# Patient Record
Sex: Male | Born: 1965 | Race: White | Hispanic: No | Marital: Single | Smoking: Never smoker
Health system: Southern US, Community
[De-identification: ages and names within clinical notes are randomized; demographics above are authoritative.]

## PROBLEM LIST (undated history)

## (undated) DIAGNOSIS — J45909 Unspecified asthma, uncomplicated: Secondary | ICD-10-CM

## (undated) DIAGNOSIS — I10 Essential (primary) hypertension: Secondary | ICD-10-CM

## (undated) HISTORY — PX: APPENDECTOMY: SHX54

---

## 2003-05-03 ENCOUNTER — Encounter: Admission: RE | Admit: 2003-05-03 | Discharge: 2003-05-03 | Payer: Self-pay | Admitting: Family Medicine

## 2003-05-05 ENCOUNTER — Encounter: Admission: RE | Admit: 2003-05-05 | Discharge: 2003-05-05 | Payer: Self-pay | Admitting: Family Medicine

## 2003-06-17 ENCOUNTER — Ambulatory Visit (HOSPITAL_COMMUNITY): Admission: RE | Admit: 2003-06-17 | Discharge: 2003-06-17 | Payer: Self-pay | Admitting: *Deleted

## 2004-01-03 ENCOUNTER — Observation Stay (HOSPITAL_COMMUNITY): Admission: EM | Admit: 2004-01-03 | Discharge: 2004-01-04 | Payer: Self-pay | Admitting: Emergency Medicine

## 2004-01-03 ENCOUNTER — Encounter: Admission: RE | Admit: 2004-01-03 | Discharge: 2004-01-03 | Payer: Self-pay | Admitting: Family Medicine

## 2005-05-17 IMAGING — CT CT PELVIS W/ CM
1 of 2 series · 15 of 32 positions shown, 20 images · IV contrast (omnipaque)
Comparison: none

CLINICAL DATA: Right lower quadrant pain for one day.  Nausea and diarrhea.  Evaluate for appendicitis.
CT ABDOMEN AND PELVIS WITH CONTRAST, 01/03/04:
Images were obtained after an IV infusion of 100 cc of Omnipaque 300.  Enteric contrast was used. 
CT ABDOMEN:
Lung bases are clear.  The liver, spleen, gallbladder, pancreas, adrenal glands, and right kidney appear normal.  A 1.8 x 1.8 cm cyst is present within the left mid to lower pole renal cortex anteriorly.

[Series 2: routine abdomen · axial · 0.70mm/px · z∈[-321,+49]mm · 15 of 82 slices shown, 20 images]
[im 4/82  soft-tissue]
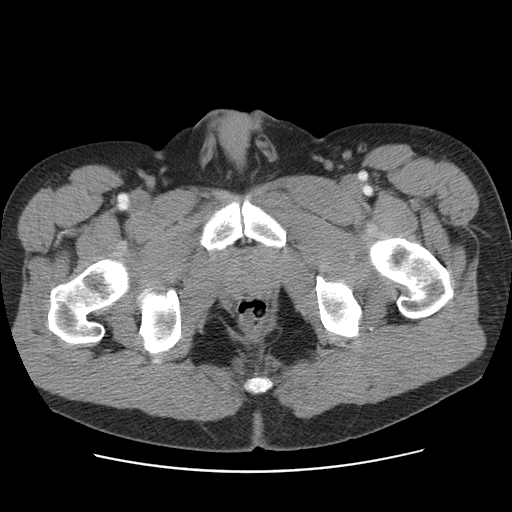
[im 4/82  bone]
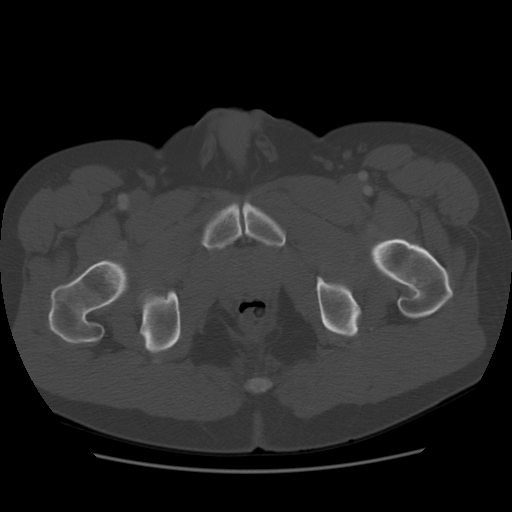
[im 12/82  soft-tissue]
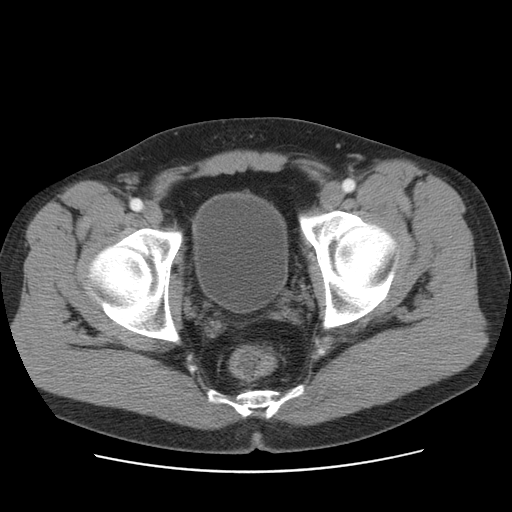
[im 15/82  soft-tissue]
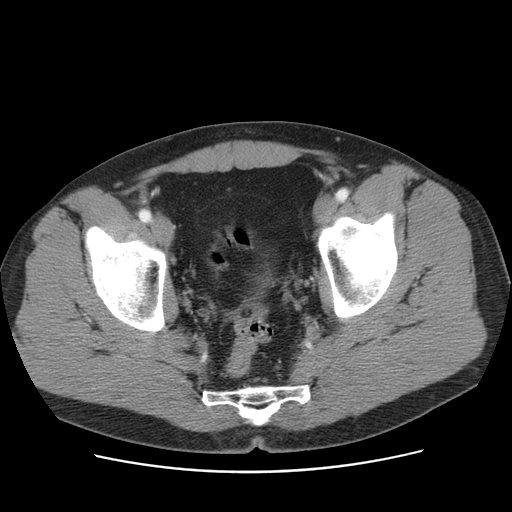
[im 23/82  soft-tissue]
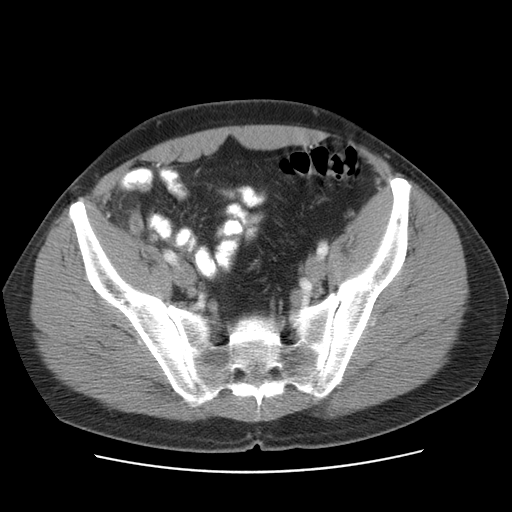
[im 26/82  soft-tissue]
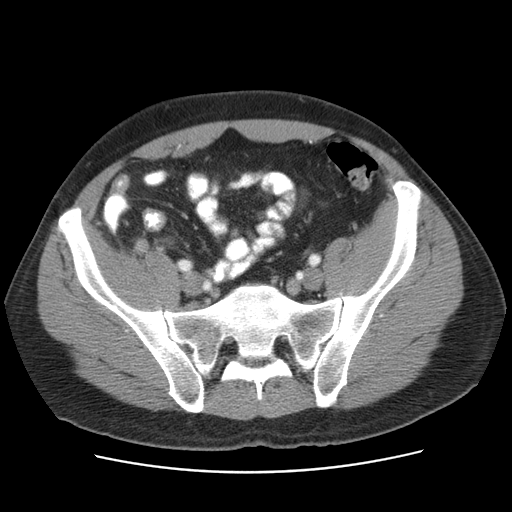
[im 34/82  soft-tissue]
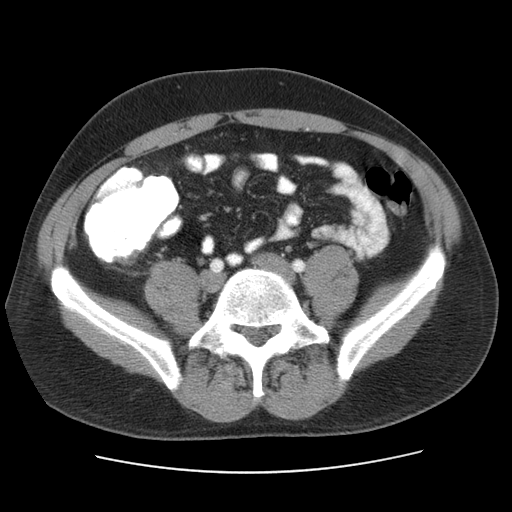
[im 37/82  soft-tissue]
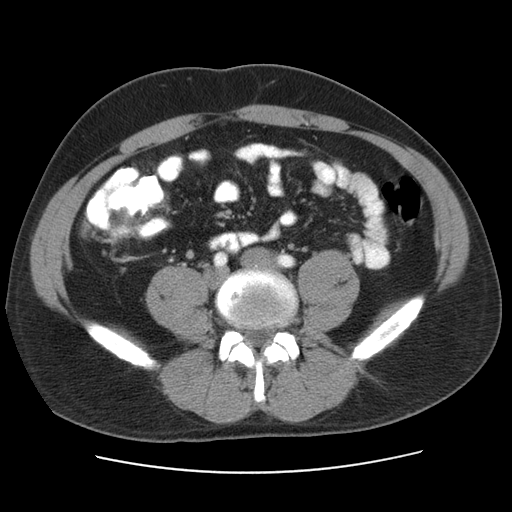
[im 45/82  soft-tissue]
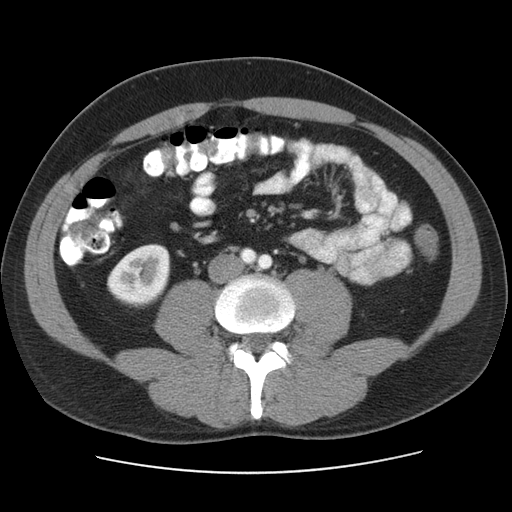
[im 48/82  soft-tissue]
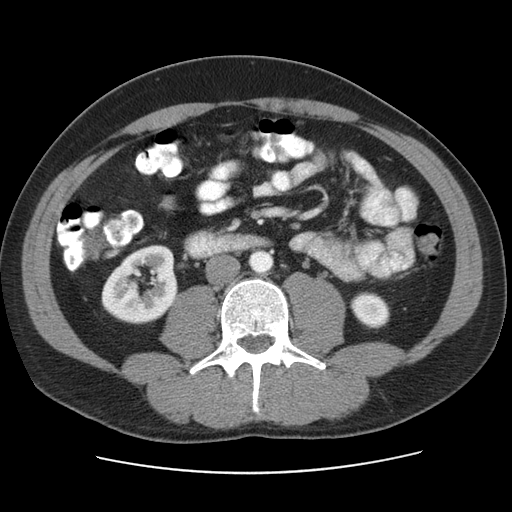
[im 48/82  bone]
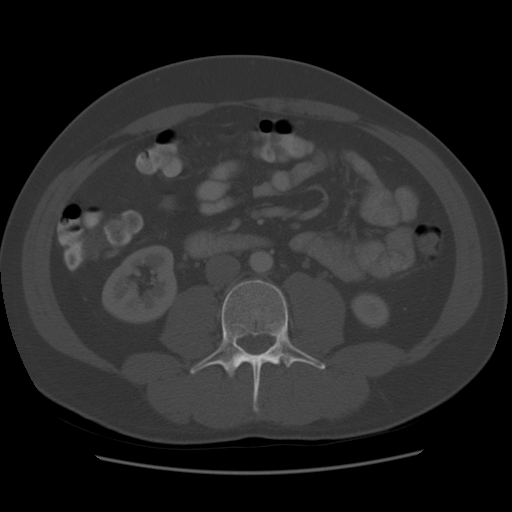
[im 56/82  soft-tissue]
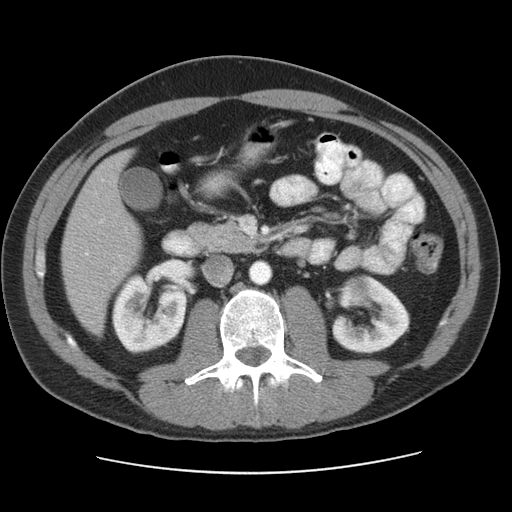
[im 59/82  soft-tissue]
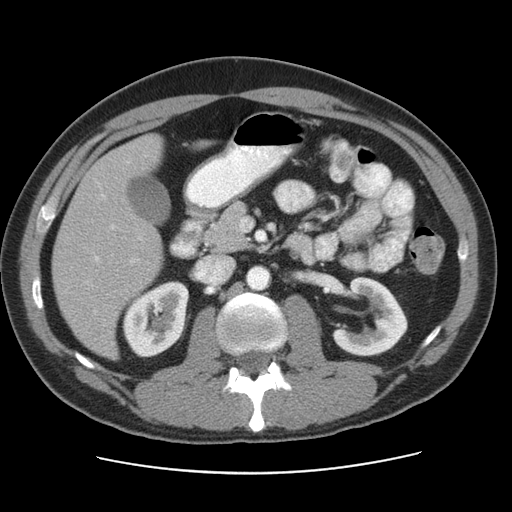
[im 67/82  soft-tissue]
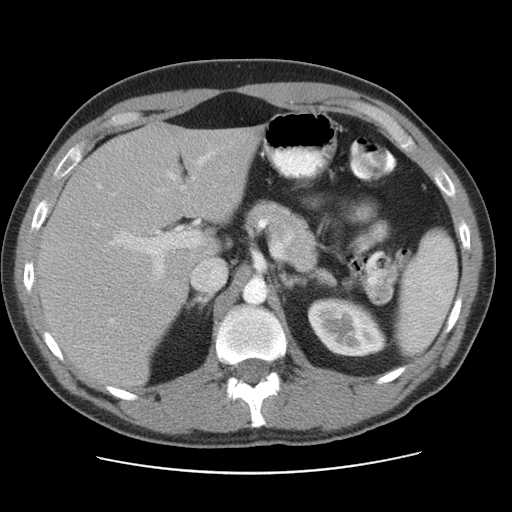
[im 67/82  lung]
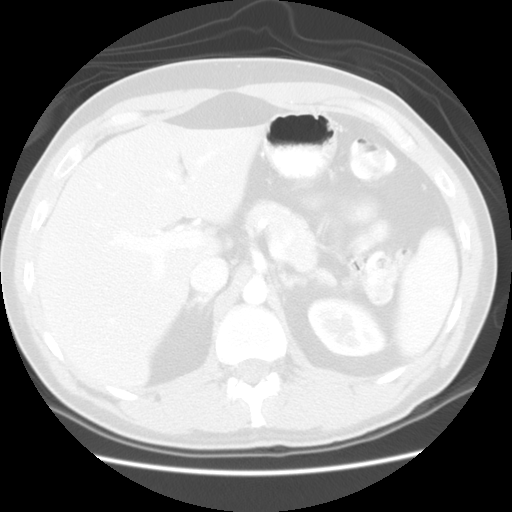
[im 70/82  soft-tissue]
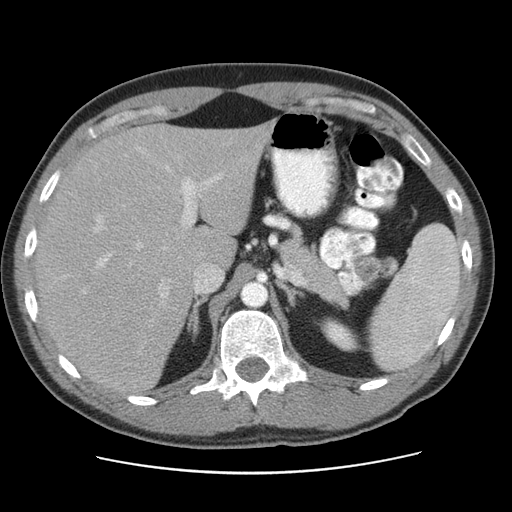
[im 70/82  lung]
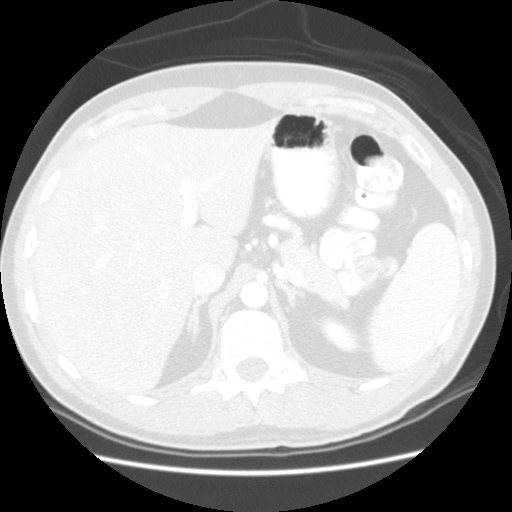
[im 74/82  lung]
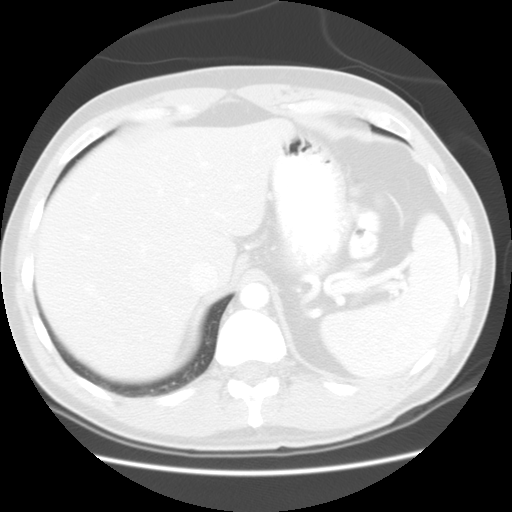
[im 78/82  soft-tissue]
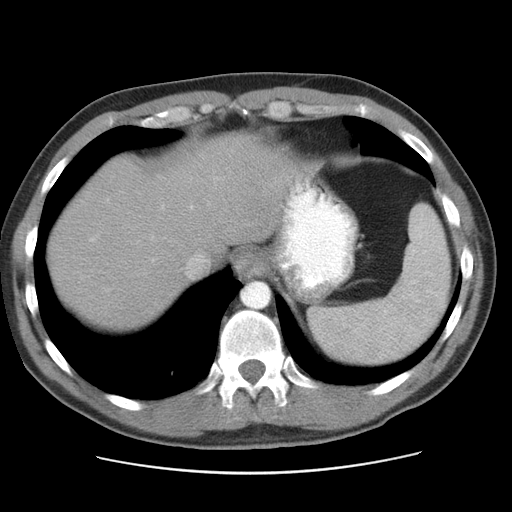
[im 78/82  lung]
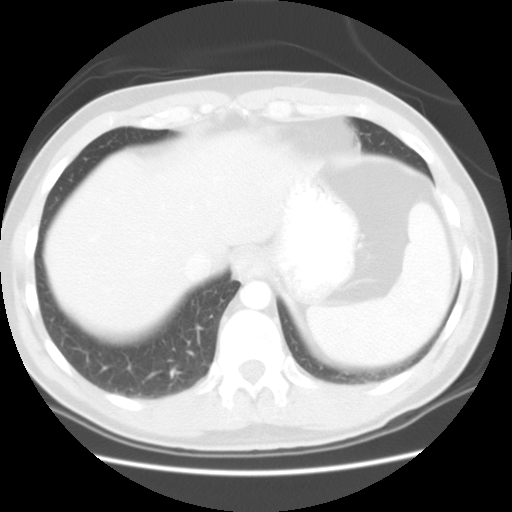

[15 of 32 positions shown; findings below may reference images not displayed]

IMPRESSION: Left renal cyst.  
CT PELVIS:
A tubular-shaped structure is seen extending from the posterior cecum inferiorly that is believed to be a distended appendix with adjacent stranding,  findings suggesting inflammation.  I see no evidence for perforation or abscess.  The bladder and prostate gland appear normal.  
Evaluation of all the bone windows show no worrisome findings.
IMPRESSION: CT findings suspicious for appendicitis.

## 2013-03-18 ENCOUNTER — Encounter: Payer: Self-pay | Admitting: *Deleted

## 2013-04-03 ENCOUNTER — Other Ambulatory Visit: Payer: Self-pay | Admitting: Physician Assistant

## 2015-08-05 DIAGNOSIS — D225 Melanocytic nevi of trunk: Secondary | ICD-10-CM | POA: Diagnosis not present

## 2015-08-05 DIAGNOSIS — L814 Other melanin hyperpigmentation: Secondary | ICD-10-CM | POA: Diagnosis not present

## 2015-08-05 DIAGNOSIS — L910 Hypertrophic scar: Secondary | ICD-10-CM | POA: Diagnosis not present

## 2015-08-05 DIAGNOSIS — L738 Other specified follicular disorders: Secondary | ICD-10-CM | POA: Diagnosis not present

## 2015-08-05 DIAGNOSIS — L905 Scar conditions and fibrosis of skin: Secondary | ICD-10-CM | POA: Diagnosis not present

## 2016-12-09 DIAGNOSIS — J069 Acute upper respiratory infection, unspecified: Secondary | ICD-10-CM | POA: Diagnosis not present

## 2017-04-14 DIAGNOSIS — Z131 Encounter for screening for diabetes mellitus: Secondary | ICD-10-CM | POA: Diagnosis not present

## 2017-04-14 DIAGNOSIS — Z0000 Encounter for general adult medical examination without abnormal findings: Secondary | ICD-10-CM | POA: Diagnosis not present

## 2017-04-14 DIAGNOSIS — Z13220 Encounter for screening for lipoid disorders: Secondary | ICD-10-CM | POA: Diagnosis not present

## 2017-05-05 DIAGNOSIS — R1310 Dysphagia, unspecified: Secondary | ICD-10-CM | POA: Diagnosis not present

## 2017-05-05 DIAGNOSIS — Z1211 Encounter for screening for malignant neoplasm of colon: Secondary | ICD-10-CM | POA: Diagnosis not present

## 2017-09-01 DIAGNOSIS — K640 First degree hemorrhoids: Secondary | ICD-10-CM | POA: Diagnosis not present

## 2017-09-01 DIAGNOSIS — K210 Gastro-esophageal reflux disease with esophagitis: Secondary | ICD-10-CM | POA: Diagnosis not present

## 2017-09-01 DIAGNOSIS — Z1211 Encounter for screening for malignant neoplasm of colon: Secondary | ICD-10-CM | POA: Diagnosis not present

## 2017-09-01 DIAGNOSIS — K209 Esophagitis, unspecified: Secondary | ICD-10-CM | POA: Diagnosis not present

## 2017-09-01 DIAGNOSIS — K5730 Diverticulosis of large intestine without perforation or abscess without bleeding: Secondary | ICD-10-CM | POA: Diagnosis not present

## 2017-09-01 DIAGNOSIS — K228 Other specified diseases of esophagus: Secondary | ICD-10-CM | POA: Diagnosis not present

## 2017-09-01 DIAGNOSIS — D126 Benign neoplasm of colon, unspecified: Secondary | ICD-10-CM | POA: Diagnosis not present

## 2017-09-01 DIAGNOSIS — R1310 Dysphagia, unspecified: Secondary | ICD-10-CM | POA: Diagnosis not present

## 2017-09-06 DIAGNOSIS — D126 Benign neoplasm of colon, unspecified: Secondary | ICD-10-CM | POA: Diagnosis not present

## 2017-09-06 DIAGNOSIS — Z1211 Encounter for screening for malignant neoplasm of colon: Secondary | ICD-10-CM | POA: Diagnosis not present

## 2017-09-06 DIAGNOSIS — K209 Esophagitis, unspecified: Secondary | ICD-10-CM | POA: Diagnosis not present

## 2017-09-06 DIAGNOSIS — K210 Gastro-esophageal reflux disease with esophagitis: Secondary | ICD-10-CM | POA: Diagnosis not present

## 2017-11-24 DIAGNOSIS — R12 Heartburn: Secondary | ICD-10-CM | POA: Diagnosis not present

## 2017-11-24 DIAGNOSIS — K210 Gastro-esophageal reflux disease with esophagitis: Secondary | ICD-10-CM | POA: Diagnosis not present

## 2017-11-24 DIAGNOSIS — K2900 Acute gastritis without bleeding: Secondary | ICD-10-CM | POA: Diagnosis not present

## 2017-11-24 DIAGNOSIS — K200 Eosinophilic esophagitis: Secondary | ICD-10-CM | POA: Diagnosis not present

## 2017-11-24 DIAGNOSIS — K2930 Chronic superficial gastritis without bleeding: Secondary | ICD-10-CM | POA: Diagnosis not present

## 2017-11-24 DIAGNOSIS — K228 Other specified diseases of esophagus: Secondary | ICD-10-CM | POA: Diagnosis not present

## 2017-11-29 DIAGNOSIS — K2930 Chronic superficial gastritis without bleeding: Secondary | ICD-10-CM | POA: Diagnosis not present

## 2017-11-29 DIAGNOSIS — K210 Gastro-esophageal reflux disease with esophagitis: Secondary | ICD-10-CM | POA: Diagnosis not present

## 2017-11-29 DIAGNOSIS — K200 Eosinophilic esophagitis: Secondary | ICD-10-CM | POA: Diagnosis not present

## 2018-03-22 DIAGNOSIS — J019 Acute sinusitis, unspecified: Secondary | ICD-10-CM | POA: Diagnosis not present

## 2018-03-22 DIAGNOSIS — Z112 Encounter for screening for other bacterial diseases: Secondary | ICD-10-CM | POA: Diagnosis not present

## 2018-03-22 DIAGNOSIS — Z1159 Encounter for screening for other viral diseases: Secondary | ICD-10-CM | POA: Diagnosis not present

## 2018-03-22 DIAGNOSIS — J309 Allergic rhinitis, unspecified: Secondary | ICD-10-CM | POA: Diagnosis not present

## 2018-03-22 DIAGNOSIS — K200 Eosinophilic esophagitis: Secondary | ICD-10-CM | POA: Diagnosis not present

## 2018-03-22 DIAGNOSIS — J069 Acute upper respiratory infection, unspecified: Secondary | ICD-10-CM | POA: Diagnosis not present

## 2018-03-22 DIAGNOSIS — J101 Influenza due to other identified influenza virus with other respiratory manifestations: Secondary | ICD-10-CM | POA: Diagnosis not present

## 2018-03-22 DIAGNOSIS — J4540 Moderate persistent asthma, uncomplicated: Secondary | ICD-10-CM | POA: Diagnosis not present

## 2018-08-20 DIAGNOSIS — R1030 Lower abdominal pain, unspecified: Secondary | ICD-10-CM | POA: Diagnosis not present

## 2018-08-20 DIAGNOSIS — K5730 Diverticulosis of large intestine without perforation or abscess without bleeding: Secondary | ICD-10-CM | POA: Diagnosis not present

## 2018-08-20 DIAGNOSIS — Z20828 Contact with and (suspected) exposure to other viral communicable diseases: Secondary | ICD-10-CM | POA: Diagnosis not present

## 2018-08-20 DIAGNOSIS — Z7951 Long term (current) use of inhaled steroids: Secondary | ICD-10-CM | POA: Diagnosis not present

## 2018-08-20 DIAGNOSIS — K5790 Diverticulosis of intestine, part unspecified, without perforation or abscess without bleeding: Secondary | ICD-10-CM | POA: Diagnosis not present

## 2018-08-20 DIAGNOSIS — K921 Melena: Secondary | ICD-10-CM | POA: Diagnosis not present

## 2018-08-20 DIAGNOSIS — Z880 Allergy status to penicillin: Secondary | ICD-10-CM | POA: Diagnosis not present

## 2018-08-20 DIAGNOSIS — R030 Elevated blood-pressure reading, without diagnosis of hypertension: Secondary | ICD-10-CM | POA: Diagnosis not present

## 2018-08-20 DIAGNOSIS — R142 Eructation: Secondary | ICD-10-CM | POA: Diagnosis not present

## 2018-08-20 DIAGNOSIS — N132 Hydronephrosis with renal and ureteral calculous obstruction: Secondary | ICD-10-CM | POA: Diagnosis not present

## 2018-08-20 DIAGNOSIS — N201 Calculus of ureter: Secondary | ICD-10-CM | POA: Diagnosis not present

## 2018-08-29 DIAGNOSIS — N200 Calculus of kidney: Secondary | ICD-10-CM | POA: Diagnosis not present

## 2018-09-12 DIAGNOSIS — N200 Calculus of kidney: Secondary | ICD-10-CM | POA: Diagnosis not present

## 2018-09-12 DIAGNOSIS — N209 Urinary calculus, unspecified: Secondary | ICD-10-CM | POA: Diagnosis not present

## 2018-09-27 DIAGNOSIS — N202 Calculus of kidney with calculus of ureter: Secondary | ICD-10-CM | POA: Diagnosis not present

## 2018-10-29 DIAGNOSIS — Z20828 Contact with and (suspected) exposure to other viral communicable diseases: Secondary | ICD-10-CM | POA: Diagnosis not present

## 2018-11-01 DIAGNOSIS — U071 COVID-19: Secondary | ICD-10-CM | POA: Diagnosis not present

## 2018-11-01 DIAGNOSIS — Z03818 Encounter for observation for suspected exposure to other biological agents ruled out: Secondary | ICD-10-CM | POA: Diagnosis not present

## 2019-03-07 DIAGNOSIS — N200 Calculus of kidney: Secondary | ICD-10-CM | POA: Diagnosis not present

## 2019-05-16 DIAGNOSIS — M9903 Segmental and somatic dysfunction of lumbar region: Secondary | ICD-10-CM | POA: Diagnosis not present

## 2019-05-21 DIAGNOSIS — M9903 Segmental and somatic dysfunction of lumbar region: Secondary | ICD-10-CM | POA: Diagnosis not present

## 2019-05-23 DIAGNOSIS — R109 Unspecified abdominal pain: Secondary | ICD-10-CM | POA: Diagnosis not present

## 2019-05-24 ENCOUNTER — Other Ambulatory Visit: Payer: Self-pay | Admitting: Family Medicine

## 2019-05-24 DIAGNOSIS — R109 Unspecified abdominal pain: Secondary | ICD-10-CM

## 2019-06-13 ENCOUNTER — Other Ambulatory Visit: Payer: Self-pay

## 2020-04-28 DIAGNOSIS — R0789 Other chest pain: Secondary | ICD-10-CM | POA: Diagnosis not present

## 2020-04-28 DIAGNOSIS — I10 Essential (primary) hypertension: Secondary | ICD-10-CM | POA: Diagnosis not present

## 2020-05-14 DIAGNOSIS — R300 Dysuria: Secondary | ICD-10-CM | POA: Diagnosis not present

## 2020-05-14 DIAGNOSIS — R102 Pelvic and perineal pain: Secondary | ICD-10-CM | POA: Diagnosis not present

## 2020-05-14 DIAGNOSIS — Z87442 Personal history of urinary calculi: Secondary | ICD-10-CM | POA: Diagnosis not present

## 2020-05-19 DIAGNOSIS — I10 Essential (primary) hypertension: Secondary | ICD-10-CM | POA: Diagnosis not present

## 2020-05-19 DIAGNOSIS — R0600 Dyspnea, unspecified: Secondary | ICD-10-CM | POA: Diagnosis not present

## 2020-05-19 DIAGNOSIS — R7303 Prediabetes: Secondary | ICD-10-CM | POA: Diagnosis not present

## 2020-09-19 DIAGNOSIS — U099 Post covid-19 condition, unspecified: Secondary | ICD-10-CM | POA: Diagnosis not present

## 2020-09-19 DIAGNOSIS — R7303 Prediabetes: Secondary | ICD-10-CM | POA: Diagnosis not present

## 2020-09-19 DIAGNOSIS — I10 Essential (primary) hypertension: Secondary | ICD-10-CM | POA: Diagnosis not present

## 2021-03-16 DIAGNOSIS — I10 Essential (primary) hypertension: Secondary | ICD-10-CM | POA: Diagnosis not present

## 2021-03-25 DIAGNOSIS — Z125 Encounter for screening for malignant neoplasm of prostate: Secondary | ICD-10-CM | POA: Diagnosis not present

## 2021-03-25 DIAGNOSIS — Z0000 Encounter for general adult medical examination without abnormal findings: Secondary | ICD-10-CM | POA: Diagnosis not present

## 2021-03-25 DIAGNOSIS — Z23 Encounter for immunization: Secondary | ICD-10-CM | POA: Diagnosis not present

## 2021-06-11 DIAGNOSIS — I10 Essential (primary) hypertension: Secondary | ICD-10-CM | POA: Diagnosis not present

## 2021-09-23 DIAGNOSIS — K200 Eosinophilic esophagitis: Secondary | ICD-10-CM | POA: Diagnosis not present

## 2021-09-23 DIAGNOSIS — Z23 Encounter for immunization: Secondary | ICD-10-CM | POA: Diagnosis not present

## 2021-09-23 DIAGNOSIS — I10 Essential (primary) hypertension: Secondary | ICD-10-CM | POA: Diagnosis not present

## 2021-09-23 DIAGNOSIS — E782 Mixed hyperlipidemia: Secondary | ICD-10-CM | POA: Diagnosis not present

## 2021-09-23 DIAGNOSIS — R7303 Prediabetes: Secondary | ICD-10-CM | POA: Diagnosis not present

## 2022-02-19 DIAGNOSIS — J321 Chronic frontal sinusitis: Secondary | ICD-10-CM | POA: Diagnosis not present

## 2022-03-31 DIAGNOSIS — Z0000 Encounter for general adult medical examination without abnormal findings: Secondary | ICD-10-CM | POA: Diagnosis not present

## 2022-03-31 DIAGNOSIS — I10 Essential (primary) hypertension: Secondary | ICD-10-CM | POA: Diagnosis not present

## 2022-03-31 DIAGNOSIS — E782 Mixed hyperlipidemia: Secondary | ICD-10-CM | POA: Diagnosis not present

## 2022-03-31 DIAGNOSIS — H9193 Unspecified hearing loss, bilateral: Secondary | ICD-10-CM | POA: Diagnosis not present

## 2022-03-31 DIAGNOSIS — R7303 Prediabetes: Secondary | ICD-10-CM | POA: Diagnosis not present

## 2022-04-13 ENCOUNTER — Other Ambulatory Visit: Payer: Self-pay

## 2022-04-13 ENCOUNTER — Emergency Department (HOSPITAL_BASED_OUTPATIENT_CLINIC_OR_DEPARTMENT_OTHER)
Admission: EM | Admit: 2022-04-13 | Discharge: 2022-04-14 | Disposition: A | Discharge status: Home / Self Care | Payer: BC Managed Care – PPO | Attending: Emergency Medicine | Admitting: Emergency Medicine

## 2022-04-13 ENCOUNTER — Emergency Department (HOSPITAL_BASED_OUTPATIENT_CLINIC_OR_DEPARTMENT_OTHER): Payer: BC Managed Care – PPO

## 2022-04-13 ENCOUNTER — Encounter (HOSPITAL_BASED_OUTPATIENT_CLINIC_OR_DEPARTMENT_OTHER): Payer: Self-pay | Admitting: Emergency Medicine

## 2022-04-13 DIAGNOSIS — K922 Gastrointestinal hemorrhage, unspecified: Secondary | ICD-10-CM | POA: Diagnosis not present

## 2022-04-13 DIAGNOSIS — K920 Hematemesis: Secondary | ICD-10-CM | POA: Insufficient documentation

## 2022-04-13 DIAGNOSIS — K5730 Diverticulosis of large intestine without perforation or abscess without bleeding: Secondary | ICD-10-CM | POA: Diagnosis not present

## 2022-04-13 LAB — CBC
HCT: 43.1 % (ref 39.0–52.0)
Hemoglobin: 15 g/dL (ref 13.0–17.0)
MCH: 31.7 pg (ref 26.0–34.0)
MCHC: 34.8 g/dL (ref 30.0–36.0)
MCV: 91.1 fL (ref 80.0–100.0)
Platelets: 216 10*3/uL (ref 150–400)
RBC: 4.73 MIL/uL (ref 4.22–5.81)
RDW: 12.3 % (ref 11.5–15.5)
WBC: 5 10*3/uL (ref 4.0–10.5)
nRBC: 0 % (ref 0.0–0.2)

## 2022-04-13 LAB — COMPREHENSIVE METABOLIC PANEL
ALT: 32 U/L (ref 0–44)
AST: 24 U/L (ref 15–41)
Albumin: 4.1 g/dL (ref 3.5–5.0)
Alkaline Phosphatase: 59 U/L (ref 38–126)
Anion gap: 8 (ref 5–15)
BUN: 18 mg/dL (ref 6–20)
CO2: 25 mmol/L (ref 22–32)
Calcium: 9 mg/dL (ref 8.9–10.3)
Chloride: 102 mmol/L (ref 98–111)
Creatinine, Ser: 0.89 mg/dL (ref 0.61–1.24)
GFR, Estimated: >60 mL/min (ref >60)
Glucose, Bld: 102 mg/dL — ABNORMAL HIGH (ref 70–99)
Potassium: 3.6 mmol/L (ref 3.5–5.1)
Sodium: 135 mmol/L (ref 135–145)
Total Bilirubin: 0.5 mg/dL (ref 0.3–1.2)
Total Protein: 6.5 g/dL (ref 6.5–8.1)

## 2022-04-13 MED ORDER — PANTOPRAZOLE SODIUM 40 MG IV SOLR
40.0000 mg | Freq: Once | INTRAVENOUS | Status: AC
  Administered 2022-04-13: 40 mg via INTRAVENOUS
  Filled 2022-04-13: qty 10

## 2022-04-13 MED ORDER — SODIUM CHLORIDE 0.9 % IV BOLUS
1000.0000 mL | Freq: Once | INTRAVENOUS | Status: AC
  Administered 2022-04-13: 1000 mL via INTRAVENOUS

## 2022-04-13 MED ORDER — IOHEXOL 350 MG/ML SOLN
100.0000 mL | Freq: Once | INTRAVENOUS | Status: AC | PRN
  Administered 2022-04-13: 100 mL via INTRAVENOUS

## 2022-04-13 NOTE — ED Provider Notes (Incomplete)
[facility] EMERGENCY DEPARTMENT AT [facility] Provider Note   CSN: [id number] Arrival date & time: 04/13/22  1741     History {Add pertinent medical, surgical, social history, OB history to HPI:1} Chief Complaint  Patient presents with  . Hematemesis    Lawrence Long is a 57 y.o. male with a past medical history of EOE presents today for evaluation of hematemesis.  Patient reports after having dinner on Saturday last week he started to have dry heaves and then vomited bright red blood.  He reports that the following day he started to have diarrhea with dark stool.  He went to [place] walk in clinic today where they recommended him to go to the ER for further workup.  States he did not have any vomiting yesterday.  States he continues to have diarrhea with dark stool.  Denies any fever, chest pain, shortness of breath, constipation, urinary symptoms.  HPI  History reviewed. No pertinent past medical history. Past Surgical History:  Procedure Laterality Date  . APPENDECTOMY       Home Medications Prior to Admission medications   Not on File      Allergies    Ampicillin    Review of Systems   Review of Systems Negative except as per HPI.  Physical Exam Updated Vital Signs BP 131/82   Pulse 75   Temp 98.7 F (37.1 C) (Oral)   Resp 18   SpO2 96%  Physical Exam Vitals and nursing note reviewed.  Constitutional:      Appearance: Normal appearance.  HENT:     Head: Normocephalic and atraumatic.     Mouth/Throat:     Mouth: Mucous membranes are moist.  Eyes:     General: No scleral icterus. Cardiovascular:     Rate and Rhythm: Normal rate and regular rhythm.     Pulses: Normal pulses.     Heart sounds: Normal heart sounds.  Pulmonary:     Effort: Pulmonary effort is normal.     Breath sounds: Normal breath sounds.  Abdominal:     General: Abdomen is flat.     Palpations: Abdomen is soft.     Tenderness: There is no abdominal tenderness.   Musculoskeletal:        General: No deformity.  Skin:    General: Skin is warm.     Findings: No rash.  Neurological:     General: No focal deficit present.     Mental Status: He is alert.  Psychiatric:        Mood and Affect: Mood normal.     ED Results / Procedures / Treatments   Labs (all labs ordered are listed, but only abnormal results are displayed) Labs Reviewed  COMPREHENSIVE METABOLIC PANEL - Abnormal; Notable for the following components:      Result Value   Glucose, Bld 102 (*)    All other components within normal limits  CBC    EKG None  Radiology No results found.  Procedures Procedures  {Document cardiac monitor, telemetry assessment procedure when appropriate:1}  Medications Ordered in ED Medications - No data to display  ED Course/ Medical Decision Making/ A&P   {   Click here for ABCD2, HEART and other calculatorsREFRESH Note before signing :1}                          Medical Decision Making Amount and/or Complexity of Data Reviewed Labs: ordered.   ***  {Document critical  care time when appropriate:1} {Document review of labs and clinical decision tools ie heart score, Chads2Vasc2 etc:1}  {Document your independent review of radiology images, and any outside records:1} {Document your discussion with family members, caretakers, and with consultants:1} {Document social determinants of health affecting pt's care:1} {Document your decision making why or why not admission, treatments were needed:1} Final Clinical Impression(s) / ED Diagnoses Final diagnoses:  None    Rx / DC Orders ED Discharge Orders     None

## 2022-04-13 NOTE — ED Triage Notes (Signed)
Blood in emesis on Sunday. N/v/d since. [facility]en by [facility] and referred for GIB work up

## 2022-04-13 NOTE — ED Provider Notes (Signed)
[place] [facility]   CSN: [id number] Arrival date & time: 04/13/22  1741     History  Chief Complaint  Patient presents with   Hematemesis    Lawrence Long is a 57 y.o. male with a past medical history of EOE presents today for evaluation of hematemesis.  Patient reports after having dinner on Saturday last week he started to have dry heaves and then vomited bright red blood.  He reports that the following day he started to have diarrhea with dark stool.  [facility] where they recommended him to go to the ER for further workup.  States he did not have any vomiting yesterday.  States he continues to have diarrhea with dark stool yesterday.  Denies any fever, chest pain, shortness of breath, constipation, urinary symptoms.  HPI  History reviewed. No pertinent past medical history. Past Surgical History:  Procedure Laterality Date   APPENDECTOMY       Home Medications Prior to Admission medications   Not on File      Allergies    Ampicillin    Review of Systems   Review of Systems Negative except as per HPI.  Physical Exam Updated Vital Signs BP 131/82   Pulse 75   Temp 98.7 F (37.1 C) (Oral)   Resp 18   SpO2 96%  Physical Exam Vitals and nursing note reviewed.  Constitutional:      Appearance: Normal appearance.  HENT:     Head: Normocephalic and atraumatic.     Mouth/Throat:     Mouth: Mucous membranes are moist.  Eyes:     General: No scleral icterus. Cardiovascular:     Rate and Rhythm: Normal rate and regular rhythm.     Pulses: Normal pulses.     Heart sounds: Normal heart sounds.  Pulmonary:     Effort: Pulmonary effort is normal.     Breath sounds: Normal breath sounds.  Abdominal:     General: Abdomen is flat.     Palpations: Abdomen is soft.     Tenderness: There is no abdominal tenderness.  Musculoskeletal:        General: No deformity.  Skin:    General:  Skin is warm.     Findings: No rash.  Neurological:     General: No focal deficit present.     Mental Status: He is alert.  Psychiatric:        Mood and Affect: Mood normal.     ED Results / Procedures / Treatments   Labs (all labs ordered are listed, but only abnormal results are displayed) Labs Reviewed  COMPREHENSIVE METABOLIC PANEL - Abnormal; Notable for the following components:      Result Value   Glucose, Bld 102 (*)    All other components within normal limits  CBC    EKG None  Radiology No results found.  Procedures Procedures    Medications Ordered in ED Medications - No data to display  ED Course/ Medical Decision Making/ A&P                             Medical Decision Making Amount and/or Complexity of Data Reviewed Labs: ordered. Radiology: ordered.  Risk Prescription drug management.   This patient presents to the ED for hematemesis, this involves an extensive number of treatment options, and is a complaint that carries with a high risk  of complications and morbidity.  The differential diagnosis includes manage request, esophagitis, PUD, cirrhosis, bleed.  This is not an exhaustive list.  Lab tests: I ordered and personally interpreted labs.  The pertinent results include: WBC unremarkable. Hbg unremarkable. Platelets unremarkable. Electrolytes unremarkable. BUN, creatinine unremarkable.   Imaging studies: I ordered imaging studies. I personally reviewed, interpreted imaging and agree with the radiologist's interpretations. The results include: CT angio GI bleed showed no active GI bleed.  Problem list/ ED course/ Critical interventions/ Medical management: HPI: See above Vital signs within normal range and stable throughout visit. Laboratory/imaging studies significant for: See above. On physical examination, patient is afebrile and appears in no acute distress.  This patient presents with symptoms concerning for an upper GI bleed.  Differential diagnoses includes peptic ulcer disease, versus gastritis/gastric ulcer, versus possible AVM. Presentation not consistent with esophageal or gastric variceal bleeding or Boerhaave's syndrome. Presentation not consistent with other etiologies upper GI bleeding at this time. No red flag features or high risk bleeding. No evidence of hemorrhagic shock. Glasgow-Blatchford Bleeding (GBS) score: 0. Based on this well validated study, the patient can safely be discharged for outpatient therapy.  I sent an Rx of Zofran and Imodium.  Advised patient to follow-up with gastroenterology for further evaluation and management.  Strict return precautions given.   I have reviewed the patient home medicines and have made adjustments as needed.  Cardiac monitoring/EKG: The patient was maintained on a cardiac monitor.  I personally reviewed and interpreted the cardiac monitor which showed an underlying rhythm of: sinus rhythm.  Additional history obtained: External records from outside source obtained and reviewed including: Chart review including previous notes, labs, imaging.  Consultations obtained:  Disposition Continued outpatient therapy. Follow-up with GI recommended for reevaluation of symptoms. Treatment plan discussed with patient.  Pt acknowledged understanding was agreeable to the plan. Worrisome signs and symptoms were discussed with patient, and patient acknowledged understanding to return to the ED if they noticed these signs and symptoms. Patient was stable upon discharge.   This chart was dictated using voice recognition software.  Despite best efforts to proofread,  errors can occur which can change the documentation meaning.          Final Clinical Impression(s) / ED Diagnoses Final diagnoses:  Hematemesis with nausea    Rx / DC Orders ED Discharge Orders          Ordered    ondansetron (ZOFRAN-ODT) 4 MG disintegrating tablet  Every 8 hours PRN        04/14/22 0017     loperamide (IMODIUM) 2 MG capsule  4 times daily PRN        04/14/22 0017              Jeanelle MallingLe, Miraya Cudney, PA 04/16/22 1056    Mardene SayerBranham, Victoria C, MD 04/16/22 1242

## 2022-04-14 MED ORDER — LOPERAMIDE HCL 2 MG PO CAPS: 2.0000 mg | ORAL_CAPSULE | Freq: Four times a day (QID) | ORAL | 0 refills | Status: DC | PRN

## 2022-04-14 MED ORDER — ONDANSETRON 4 MG PO TBDP: 4.0000 mg | ORAL_TABLET | Freq: Three times a day (TID) | ORAL | 0 refills | Status: DC | PRN

## 2022-04-14 NOTE — Discharge Instructions (Addendum)
Please take your medications as prescribed. Take tylenol/ibuprofen for pain. I recommend close follow-up with gastroenterology for reevaluation.  Please do not hesitate to return to emergency department if worrisome signs symptoms we discussed become apparent.  

## 2022-06-30 DIAGNOSIS — R1310 Dysphagia, unspecified: Secondary | ICD-10-CM | POA: Diagnosis not present

## 2022-06-30 DIAGNOSIS — Z09 Encounter for follow-up examination after completed treatment for conditions other than malignant neoplasm: Secondary | ICD-10-CM | POA: Diagnosis not present

## 2022-06-30 DIAGNOSIS — Z86010 Personal history of colonic polyps: Secondary | ICD-10-CM | POA: Diagnosis not present

## 2022-07-20 DIAGNOSIS — K2930 Chronic superficial gastritis without bleeding: Secondary | ICD-10-CM | POA: Diagnosis not present

## 2022-07-20 DIAGNOSIS — K31A19 Gastric intestinal metaplasia without dysplasia, unspecified site: Secondary | ICD-10-CM | POA: Diagnosis not present

## 2022-07-20 DIAGNOSIS — K2940 Chronic atrophic gastritis without bleeding: Secondary | ICD-10-CM | POA: Diagnosis not present

## 2022-07-20 DIAGNOSIS — Z86010 Personal history of colonic polyps: Secondary | ICD-10-CM | POA: Diagnosis not present

## 2022-07-20 DIAGNOSIS — Z09 Encounter for follow-up examination after completed treatment for conditions other than malignant neoplasm: Secondary | ICD-10-CM | POA: Diagnosis not present

## 2022-07-20 DIAGNOSIS — K2100 Gastro-esophageal reflux disease with esophagitis, without bleeding: Secondary | ICD-10-CM | POA: Diagnosis not present

## 2022-07-20 DIAGNOSIS — K5730 Diverticulosis of large intestine without perforation or abscess without bleeding: Secondary | ICD-10-CM | POA: Diagnosis not present

## 2022-07-20 DIAGNOSIS — K200 Eosinophilic esophagitis: Secondary | ICD-10-CM | POA: Diagnosis not present

## 2022-07-20 DIAGNOSIS — K648 Other hemorrhoids: Secondary | ICD-10-CM | POA: Diagnosis not present

## 2022-07-20 DIAGNOSIS — R1310 Dysphagia, unspecified: Secondary | ICD-10-CM | POA: Diagnosis not present

## 2022-07-20 DIAGNOSIS — D120 Benign neoplasm of cecum: Secondary | ICD-10-CM | POA: Diagnosis not present

## 2022-08-08 DIAGNOSIS — S6991XA Unspecified injury of right wrist, hand and finger(s), initial encounter: Secondary | ICD-10-CM | POA: Diagnosis not present

## 2022-08-08 DIAGNOSIS — Z23 Encounter for immunization: Secondary | ICD-10-CM | POA: Diagnosis not present

## 2022-08-08 DIAGNOSIS — X58XXXA Exposure to other specified factors, initial encounter: Secondary | ICD-10-CM | POA: Diagnosis not present

## 2022-08-17 ENCOUNTER — Ambulatory Visit (INDEPENDENT_AMBULATORY_CARE_PROVIDER_SITE_OTHER): Payer: BC Managed Care – PPO | Admitting: Orthopedic Surgery

## 2022-08-17 ENCOUNTER — Other Ambulatory Visit (INDEPENDENT_AMBULATORY_CARE_PROVIDER_SITE_OTHER): Payer: BC Managed Care – PPO

## 2022-08-17 DIAGNOSIS — M79644 Pain in right finger(s): Secondary | ICD-10-CM

## 2022-08-17 DIAGNOSIS — L089 Local infection of the skin and subcutaneous tissue, unspecified: Secondary | ICD-10-CM

## 2022-08-17 DIAGNOSIS — S60942A Unspecified superficial injury of right middle finger, initial encounter: Secondary | ICD-10-CM | POA: Diagnosis not present

## 2022-08-17 NOTE — H&P (View-Only) (Signed)
Lawrence Long - 57 y.o. male MRN [id number]  Date of birth: 06-08-1965  Office Visit Note: Visit Date: 08/17/2022 PCP: Pcp, No Referred by: No ref. provider found  Subjective: No chief complaint on file.  HPI: [facility] Long is a pleasant 57 y.o. male who presents today for evaluation of ongoing pain and swelling of the distal aspect of the right long finger.  He sustained a crush injury on 729, approximately 2 weeks prior, was crushed by a metal fender.  Did not seek medical care at that time.  Was recently seen in urgent care earlier this week for ongoing swelling and drainage, does state that there has been purulence notable.  Denies any fevers or chills, no systemic symptoms.  He is right-hand dominant, overall healthy and active at baseline.  Has been taking over-the-counter antibiotics since his recent urgent care visit.  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   Assessment & Plan: Visit Diagnoses:  1. Pain in right finger(s)     Plan: Extensive discussion was had the patient today regarding his right long finger.  Clinically, there does appear to be evidence of ongoing swelling and drainage at the distal aspect of the long finger, there is concern for potential abscess or reaction to a foreign material within this region.  There is evidence of nailbed injury as well.  At this juncture, given his ongoing swelling and drainage, he is indicated for right long finger irrigation and debridement in the operative setting with possible foreign  body removal.  Risks and benefits of the procedure were discussed, risks including but not limited to infection, bleeding, scarring, stiffness, nerve injury, vascular injury, hardware complication, recurrence of symptoms, persistent foreign material and need for subsequent operation.  Patient expressed understanding.  Will move forward with surgical scheduling at the next available date.  Continue antibiotics as prescribed.    Follow-up: No follow-ups on file.   Meds & Orders: No orders of the defined types were placed in this encounter.   Orders Placed This Encounter  Procedures   XR Finger Middle Right     Procedures: No procedures performed      Clinical History: No specialty comments available.  He reports that he has never smoked. He does not have any smokeless tobacco history on file. No results for input(s): "HGBA1C", "LABURIC" in the last 8760 hours.  Objective:   Vital Signs: There were no vitals taken for this visit.  Physical Exam  Gen: Well-appearing, in no acute distress; non-toxic CV: Regular Rate. Well-perfused. Warm.  Resp: Breathing unlabored on room air; no wheezing. Psych: Fluid speech in conversation; appropriate affect; normal thought process Neuro: Sensation intact throughout. No gross coordination deficits.   Ortho Exam Right long finger: - Notable swelling and erythema at the distal aspect of the long finger, there is expressible drainage with deep palpation - Notable nailbed injury, without exposed distal phalanx - Sensation is intact distally, warm and well-perfused - Motion is preserved at the PIP and DIP with flexion/extension  Imaging: XR Finger Middle Right  Result Date: 08/17/2022 X-rays of the right long finger were obtained today,  multiple views X-rays demonstrate well-preserved joint space at the DIP and PIP region, no bony abnormalities.  There is an opacity notable just distal and dorsal to the distal phalanx, possible foreign body.   Notable swelling seen dorsally and distally with the soft tissue as well.   Past Medical/Family/Surgical/Social History: Medications & Allergies reviewed per EMR, new medications updated. There are no problems to display for this patient.  No past medical history on file. Family History  Family history unknown: Yes   Past Surgical History:  Procedure Laterality Date   APPENDECTOMY     Social History   Occupational History   Not on file  Tobacco Use   Smoking status: Never   Smokeless tobacco: Not on file  Substance and Sexual Activity   Alcohol use: No   Drug use: No   Sexual activity: Not on file    Yuliana Vandrunen Fara Boros) Denese Killings, M.D. [facility] 9:31 AM

## 2022-08-17 NOTE — Progress Notes (Signed)
[facility] - 57 y.o. male MRN [id number]  Date of birth: 06-08-1965  Office Visit Note: Visit Date: 08/17/2022 PCP: Pcp, No Referred by: No ref. provider found  Subjective: No chief complaint on file.  HPI: [facility] Long is a pleasant 58 y.o. male who presents today for evaluation of ongoing pain and swelling of the distal aspect of the right long finger.  He sustained a crush injury on 729, approximately 2 weeks prior, was crushed by a metal fender.  Did not seek medical care at that time.  Was recently seen in urgent care earlier this week for ongoing swelling and drainage, does state that there has been purulence notable.  Denies any fevers or chills, no systemic symptoms.  He is right-hand dominant, overall healthy and active at baseline.  Has been taking over-the-counter antibiotics since his recent urgent care visit.  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   Assessment & Plan: Visit Diagnoses:  1. Pain in right finger(s)     Plan: Extensive discussion was had the patient today regarding his right long finger.  Clinically, there does appear to be evidence of ongoing swelling and drainage at the distal aspect of the long finger, there is concern for potential abscess or reaction to a foreign material within this region.  There is evidence of nailbed injury as well.  At this juncture, given his ongoing swelling and drainage, he is indicated for right long finger irrigation and debridement in the operative setting with possible foreign  body removal.  Risks and benefits of the procedure were discussed, risks including but not limited to infection, bleeding, scarring, stiffness, nerve injury, vascular injury, hardware complication, recurrence of symptoms, persistent foreign material and need for subsequent operation.  Patient expressed understanding.  Will move forward with surgical scheduling at the next available date.  Continue antibiotics as prescribed.    Follow-up: No follow-ups on file.   Meds & Orders: No orders of the defined types were placed in this encounter.   Orders Placed This Encounter  Procedures   XR Finger Middle Right     Procedures: No procedures performed      Clinical History: No specialty comments available.  He reports that he has never smoked. He does not have any smokeless tobacco history on file. No results for input(s): "HGBA1C", "LABURIC" in the last 8760 hours.  Objective:   Vital Signs: There were no vitals taken for this visit.  Physical Exam  Gen: Well-appearing, in no acute distress; non-toxic CV: Regular Rate. Well-perfused. Warm.  Resp: Breathing unlabored on room air; no wheezing. Psych: Fluid speech in conversation; appropriate affect; normal thought process Neuro: Sensation intact throughout. No gross coordination deficits.   Ortho Exam Right long finger: - Notable swelling and erythema at the distal aspect of the long finger, there is expressible drainage with deep palpation - Notable nailbed injury, without exposed distal phalanx - Sensation is intact distally, warm and well-perfused - Motion is preserved at the PIP and DIP with flexion/extension  Imaging: XR Finger Middle Right  Result Date: 08/17/2022 X-rays of the right long finger were obtained today,  multiple views X-rays demonstrate well-preserved joint space at the DIP and PIP region, no bony abnormalities.  There is an opacity notable just distal and dorsal to the distal phalanx, possible foreign body.   Notable swelling seen dorsally and distally with the soft tissue as well.   Past Medical/Family/Surgical/Social History: Medications & Allergies reviewed per EMR, new medications updated. There are no problems to display for this patient.  No past medical history on file. Family History  Family history unknown: Yes   Past Surgical History:  Procedure Laterality Date   APPENDECTOMY     Social History   Occupational History   Not on file  Tobacco Use   Smoking status: Never   Smokeless tobacco: Not on file  Substance and Sexual Activity   Alcohol use: No   Drug use: No   Sexual activity: Not on file    Lawrence Long) [facility], M.D. [facility] 9:31 AM

## 2022-08-18 ENCOUNTER — Other Ambulatory Visit: Payer: Self-pay

## 2022-08-18 ENCOUNTER — Encounter (HOSPITAL_COMMUNITY): Payer: Self-pay | Admitting: Orthopedic Surgery

## 2022-08-18 NOTE — Progress Notes (Signed)
SDW call  Patient was given pre-op instructions over the phone. Patient verbalized understanding of instructions provided. Patient states he is taking Keflex for his finger infection.      PCP -  [facility] Cardiologist - denies Pulmonary:    PPM/ICD - denies Device Orders - n/a Rep Notified - n/a   Chest x-ray - n/a EKG -  DOS, 08/19/2022 Stress Test - ECHO -  Cardiac Cath -   Sleep Study/sleep apnea/CPAP: denies  Non-diabetic   Blood Thinner Instructions: denies Aspirin Instructions:denies   ERAS Protcol - Yes, clear fluids until 0715   COVID TEST- n/a    Anesthesia review: No   Patient denies shortness of breath, fever, cough and chest pain over the phone call  Your procedure is scheduled on Thursday August 19, 2022  Report to [facility] Main Entrance "A" at  0745 A.M., then check in with the Admitting office.  Call this number if you have problems the morning of surgery:  [phone]   If you have any questions prior to your surgery date call [phone]: Open Monday-Friday 8am-4pm If you experience any cold or flu symptoms such as cough, fever, chills, shortness of breath, etc. between now and your scheduled surgery, please notify us at the above number    Remember:  Do not eat after midnight the night before your surgery  You may drink clear liquids until  0715  the morning of your surgery.   Clear liquids allowed are: Water, Non-Citrus Juices (without pulp), Carbonated Beverages, Clear Tea, Black Coffee ONLY (NO MILK, CREAM OR POWDERED CREAMER of any kind), and Gatorade   Take these medicines the morning of surgery with A SIP OF WATER:  Amlodipine, keflex, airduo inhaler, metoprolol  As of today, STOP taking any Aspirin (unless otherwise instructed by your surgeon) Aleve, Naproxen, Ibuprofen, Motrin, Advil, Goody's, BC's, all herbal medications, fish oil, and all vitamins.

## 2022-08-19 ENCOUNTER — Ambulatory Visit (HOSPITAL_COMMUNITY): Payer: BC Managed Care – PPO | Admitting: Anesthesiology

## 2022-08-19 ENCOUNTER — Other Ambulatory Visit: Payer: Self-pay

## 2022-08-19 ENCOUNTER — Encounter (HOSPITAL_COMMUNITY)
Admission: RE | Disposition: A | Discharge status: Home / Self Care | Payer: Self-pay | Source: Home / Self Care | Attending: Orthopedic Surgery

## 2022-08-19 ENCOUNTER — Encounter (HOSPITAL_COMMUNITY): Payer: Self-pay | Admitting: Orthopedic Surgery

## 2022-08-19 ENCOUNTER — Ambulatory Visit (HOSPITAL_COMMUNITY)
Admission: RE | Admit: 2022-08-19 | Discharge: 2022-08-19 | Disposition: A | Discharge status: Home / Self Care | Payer: BC Managed Care – PPO | Attending: Orthopedic Surgery | Admitting: Orthopedic Surgery

## 2022-08-19 DIAGNOSIS — L03011 Cellulitis of right finger: Secondary | ICD-10-CM | POA: Diagnosis not present

## 2022-08-19 DIAGNOSIS — J45909 Unspecified asthma, uncomplicated: Secondary | ICD-10-CM | POA: Diagnosis not present

## 2022-08-19 DIAGNOSIS — L02511 Cutaneous abscess of right hand: Secondary | ICD-10-CM | POA: Diagnosis not present

## 2022-08-19 DIAGNOSIS — W230XXA Caught, crushed, jammed, or pinched between moving objects, initial encounter: Secondary | ICD-10-CM | POA: Insufficient documentation

## 2022-08-19 DIAGNOSIS — M79644 Pain in right finger(s): Secondary | ICD-10-CM | POA: Diagnosis not present

## 2022-08-19 DIAGNOSIS — L089 Local infection of the skin and subcutaneous tissue, unspecified: Secondary | ICD-10-CM | POA: Diagnosis not present

## 2022-08-19 DIAGNOSIS — I10 Essential (primary) hypertension: Secondary | ICD-10-CM | POA: Diagnosis not present

## 2022-08-19 HISTORY — DX: Essential (primary) hypertension: I10

## 2022-08-19 HISTORY — DX: Unspecified asthma, uncomplicated: J45.909

## 2022-08-19 HISTORY — PX: INCISION AND DRAINAGE WOUND WITH FOREIGN BODY REMOVAL: SHX5635

## 2022-08-19 LAB — BASIC METABOLIC PANEL
Anion gap: 9 (ref 5–15)
BUN: 14 mg/dL (ref 6–20)
CO2: 22 mmol/L (ref 22–32)
Calcium: 8.7 mg/dL — ABNORMAL LOW (ref 8.9–10.3)
Chloride: 106 mmol/L (ref 98–111)
Creatinine, Ser: 0.95 mg/dL (ref 0.61–1.24)
GFR, Estimated: >60 mL/min (ref >60)
Glucose, Bld: 99 mg/dL (ref 70–99)
Potassium: 4 mmol/L (ref 3.5–5.1)
Sodium: 137 mmol/L (ref 135–145)

## 2022-08-19 LAB — CBC
HCT: 44.8 % (ref 39.0–52.0)
Hemoglobin: 14.9 g/dL (ref 13.0–17.0)
MCH: 31.2 pg (ref 26.0–34.0)
MCHC: 33.3 g/dL (ref 30.0–36.0)
MCV: 93.9 fL (ref 80.0–100.0)
Platelets: 204 10*3/uL (ref 150–400)
RBC: 4.77 MIL/uL (ref 4.22–5.81)
RDW: 12.2 % (ref 11.5–15.5)
WBC: 5.4 10*3/uL (ref 4.0–10.5)
nRBC: 0 % (ref 0.0–0.2)

## 2022-08-19 SURGERY — INCISION AND DRAINAGE WOUND WITH FOREIGN BODY REMOVAL
Anesthesia: Monitor Anesthesia Care | Site: Middle Finger | Laterality: Right

## 2022-08-19 MED ORDER — LACTATED RINGERS IV SOLN: INTRAVENOUS | Status: DC

## 2022-08-19 MED ORDER — OXYCODONE HCL 5 MG PO TABS: 5.0000 mg | ORAL_TABLET | Freq: Once | ORAL | Status: DC | PRN

## 2022-08-19 MED ORDER — EPHEDRINE 5 MG/ML INJ
INTRAVENOUS | Status: AC
  Filled 2022-08-19: qty 5

## 2022-08-19 MED ORDER — PHENYLEPHRINE 80 MCG/ML (10ML) SYRINGE FOR IV PUSH (FOR BLOOD PRESSURE SUPPORT)
PREFILLED_SYRINGE | INTRAVENOUS | Status: AC
  Filled 2022-08-19: qty 10

## 2022-08-19 MED ORDER — ONDANSETRON HCL 4 MG/2ML IJ SOLN
INTRAMUSCULAR | Status: AC
  Filled 2022-08-19: qty 2

## 2022-08-19 MED ORDER — CHLORHEXIDINE GLUCONATE 0.12 % MT SOLN
15.0000 mL | Freq: Once | OROMUCOSAL | Status: AC
  Administered 2022-08-19: 15 mL via OROMUCOSAL
  Filled 2022-08-19: qty 15

## 2022-08-19 MED ORDER — LIDOCAINE HCL (PF) 1 % IJ SOLN
INTRAMUSCULAR | Status: DC | PRN
  Administered 2022-08-19: 10 mL

## 2022-08-19 MED ORDER — MIDAZOLAM HCL 2 MG/2ML IJ SOLN
INTRAMUSCULAR | Status: AC
  Filled 2022-08-19: qty 2

## 2022-08-19 MED ORDER — PROPOFOL 500 MG/50ML IV EMUL
INTRAVENOUS | Status: DC | PRN
  Administered 2022-08-19: 150 ug/kg/min via INTRAVENOUS

## 2022-08-19 MED ORDER — ROCURONIUM BROMIDE 10 MG/ML (PF) SYRINGE
PREFILLED_SYRINGE | INTRAVENOUS | Status: AC
  Filled 2022-08-19: qty 10

## 2022-08-19 MED ORDER — FENTANYL CITRATE (PF) 250 MCG/5ML IJ SOLN
INTRAMUSCULAR | Status: AC
  Filled 2022-08-19: qty 5

## 2022-08-19 MED ORDER — SODIUM CHLORIDE 0.9 % IR SOLN
Status: DC | PRN
  Administered 2022-08-19: 1000 mL

## 2022-08-19 MED ORDER — DEXAMETHASONE SODIUM PHOSPHATE 10 MG/ML IJ SOLN
INTRAMUSCULAR | Status: DC | PRN
  Administered 2022-08-19: 5 mg via INTRAVENOUS

## 2022-08-19 MED ORDER — ONDANSETRON HCL 4 MG/2ML IJ SOLN
INTRAMUSCULAR | Status: DC | PRN
  Administered 2022-08-19: 4 mg via INTRAVENOUS

## 2022-08-19 MED ORDER — ACETAMINOPHEN 10 MG/ML IV SOLN: 1000.0000 mg | Freq: Once | INTRAVENOUS | Status: DC | PRN

## 2022-08-19 MED ORDER — MIDAZOLAM HCL 2 MG/2ML IJ SOLN
INTRAMUSCULAR | Status: DC | PRN
  Administered 2022-08-19: 2 mg via INTRAVENOUS

## 2022-08-19 MED ORDER — DEXAMETHASONE SODIUM PHOSPHATE 10 MG/ML IJ SOLN
INTRAMUSCULAR | Status: AC
  Filled 2022-08-19: qty 1

## 2022-08-19 MED ORDER — FENTANYL CITRATE (PF) 250 MCG/5ML IJ SOLN
INTRAMUSCULAR | Status: DC | PRN
  Administered 2022-08-19 (×2): 25 ug via INTRAVENOUS

## 2022-08-19 MED ORDER — ORAL CARE MOUTH RINSE: 15.0000 mL | Freq: Once | OROMUCOSAL | Status: AC

## 2022-08-19 MED ORDER — LIDOCAINE 2% (20 MG/ML) 5 ML SYRINGE
INTRAMUSCULAR | Status: AC
  Filled 2022-08-19: qty 5

## 2022-08-19 MED ORDER — CEFAZOLIN SODIUM-DEXTROSE 2-4 GM/100ML-% IV SOLN
2.0000 g | INTRAVENOUS | Status: AC
  Administered 2022-08-19: 2 g via INTRAVENOUS
  Filled 2022-08-19: qty 100

## 2022-08-19 MED ORDER — OXYCODONE HCL 5 MG/5ML PO SOLN: 5.0000 mg | Freq: Once | ORAL | Status: DC | PRN

## 2022-08-19 MED ORDER — DROPERIDOL 2.5 MG/ML IJ SOLN: 0.6250 mg | Freq: Once | INTRAMUSCULAR | Status: DC | PRN

## 2022-08-19 MED ORDER — LIDOCAINE HCL 1 % IJ SOLN
INTRAMUSCULAR | Status: AC
  Filled 2022-08-19: qty 20

## 2022-08-19 MED ORDER — FENTANYL CITRATE (PF) 100 MCG/2ML IJ SOLN: 25.0000 ug | INTRAMUSCULAR | Status: DC | PRN

## 2022-08-19 SURGICAL SUPPLY — 52 items
BAG COUNTER SPONGE SURGICOUNT (BAG) ×2 IMPLANT
BAG SPNG CNTER NS LX DISP (BAG)
BNDG CMPR 5X2 CHSV 1 LYR STRL (GAUZE/BANDAGES/DRESSINGS) ×1
BNDG CMPR 75X21 PLY HI ABS (MISCELLANEOUS) ×1
BNDG COHESIVE 2X5 TAN ST LF (GAUZE/BANDAGES/DRESSINGS) IMPLANT
BNDG ELASTIC 4X5.8 VLCR STR LF (GAUZE/BANDAGES/DRESSINGS) ×2 IMPLANT
BNDG GAUZE DERMACEA FLUFF 4 (GAUZE/BANDAGES/DRESSINGS) ×6 IMPLANT
BNDG GZE DERMACEA 4 6PLY (GAUZE/BANDAGES/DRESSINGS)
CORD BIPOLAR FORCEPS 12FT (ELECTRODE) ×2 IMPLANT
COVER SURGICAL LIGHT HANDLE (MISCELLANEOUS) ×2 IMPLANT
CUFF TOURN SGL QUICK 18X4 (TOURNIQUET CUFF) ×2 IMPLANT
CUFF TOURN SGL QUICK 24 (TOURNIQUET CUFF)
CUFF TRNQT CYL 24X4X16.5-23 (TOURNIQUET CUFF) IMPLANT
DRSG ADAPTIC 3X8 NADH LF (GAUZE/BANDAGES/DRESSINGS) ×2 IMPLANT
DRSG XEROFORM 1X8 (GAUZE/BANDAGES/DRESSINGS) IMPLANT
GAUZE SPONGE 4X4 12PLY STRL (GAUZE/BANDAGES/DRESSINGS) ×2 IMPLANT
GAUZE STRETCH 2X75IN STRL (MISCELLANEOUS) IMPLANT
GAUZE XEROFORM 1X8 LF (GAUZE/BANDAGES/DRESSINGS) ×2 IMPLANT
GLOVE BIOGEL M 8.0 STRL (GLOVE) ×2 IMPLANT
GLOVE SS BIOGEL STRL SZ 8 (GLOVE) ×2 IMPLANT
GOWN STRL REUS W/ TWL LRG LVL3 (GOWN DISPOSABLE) ×2 IMPLANT
GOWN STRL REUS W/ TWL XL LVL3 (GOWN DISPOSABLE) ×4 IMPLANT
GOWN STRL REUS W/TWL LRG LVL3 (GOWN DISPOSABLE) ×1
GOWN STRL REUS W/TWL XL LVL3 (GOWN DISPOSABLE) ×1
KIT BASIN OR (CUSTOM PROCEDURE TRAY) ×2 IMPLANT
KIT TURNOVER KIT B (KITS) ×2 IMPLANT
MANIFOLD NEPTUNE II (INSTRUMENTS) ×2 IMPLANT
NDL 18GX1X1/2 (RX/OR ONLY) (NEEDLE) IMPLANT
NDL HYPO 25GX1X1/2 BEV (NEEDLE) IMPLANT
NEEDLE 18GX1X1/2 (RX/OR ONLY) (NEEDLE) ×1 IMPLANT
NEEDLE HYPO 25GX1X1/2 BEV (NEEDLE) ×1 IMPLANT
NS IRRIG 1000ML POUR BTL (IV SOLUTION) ×2 IMPLANT
PACK ORTHO EXTREMITY (CUSTOM PROCEDURE TRAY) ×2 IMPLANT
PAD ARMBOARD 7.5X6 YLW CONV (MISCELLANEOUS) ×2 IMPLANT
PAD CAST 4YDX4 CTTN HI CHSV (CAST SUPPLIES) ×2 IMPLANT
PADDING CAST COTTON 4X4 STRL (CAST SUPPLIES)
SET CYSTO W/LG BORE CLAMP LF (SET/KITS/TRAYS/PACK) ×2 IMPLANT
SOL PREP POV-IOD 4OZ 10% (MISCELLANEOUS) ×4 IMPLANT
SPONGE T-LAP 4X18 ~~LOC~~+RFID (SPONGE) ×2 IMPLANT
STOCKINETTE IMPERVIOUS 9X36 MD (GAUZE/BANDAGES/DRESSINGS) IMPLANT
SUCTION TUBE FRAZIER 12FR DISP (SUCTIONS) IMPLANT
SUT ETHILON 4 0 PS 2 18 (SUTURE) IMPLANT
SWAB COLLECTION DEVICE MRSA (MISCELLANEOUS) IMPLANT
SWAB CULTURE ESWAB REG 1ML (MISCELLANEOUS) IMPLANT
SYR CONTROL 10ML LL (SYRINGE) IMPLANT
TOURNI-COT LRG GREEN (MISCELLANEOUS) ×1 IMPLANT
TOURNIQUET ADLT LRG GRN (MISCELLANEOUS) IMPLANT
TOWEL GREEN STERILE (TOWEL DISPOSABLE) ×2 IMPLANT
TOWEL GREEN STERILE FF (TOWEL DISPOSABLE) ×2 IMPLANT
TUBE CONNECTING 12X1/4 (SUCTIONS) ×2 IMPLANT
WATER STERILE IRR 1000ML POUR (IV SOLUTION) ×2 IMPLANT
YANKAUER SUCT BULB TIP NO VENT (SUCTIONS) ×2 IMPLANT

## 2022-08-19 NOTE — Anesthesia Preprocedure Evaluation (Addendum)
Anesthesia Evaluation  Patient identified by MRN, date of birth, ID band Patient awake    Reviewed: Allergy & Precautions, H&P , NPO status , Patient's Chart, lab work & pertinent test results  Airway Mallampati: II  TM Distance: >3 FB Neck ROM: Full    Dental no notable dental hx.    Pulmonary asthma    Pulmonary exam normal breath sounds clear to auscultation       Cardiovascular hypertension, Normal cardiovascular exam Rhythm:Regular Rate:Normal     Neuro/Psych negative neurological ROS  negative psych ROS   GI/Hepatic negative GI ROS, Neg liver ROS,,,  Endo/Other  negative endocrine ROS    Renal/GU negative Renal ROS  negative genitourinary   Musculoskeletal negative musculoskeletal ROS (+)    Abdominal   Peds negative pediatric ROS (+)  Hematology negative hematology ROS (+)   Anesthesia Other Findings   Reproductive/Obstetrics negative OB ROS                             Anesthesia Physical Anesthesia Plan  ASA: 2  Anesthesia Plan: MAC   Post-op Pain Management:    Induction: Intravenous  PONV Risk Score and Plan: Propofol infusion, Treatment may vary due to age or medical condition and Ondansetron  Airway Management Planned: Natural Airway  Additional Equipment:   Intra-op Plan:   Post-operative Plan:   Informed Consent: I have reviewed the patients History and Physical, chart, labs and discussed the procedure including the risks, benefits and alternatives for the proposed anesthesia with the patient or authorized representative who has indicated his/her understanding and acceptance.     Dental advisory given  Plan Discussed with: CRNA  Anesthesia Plan Comments:        Anesthesia Quick Evaluation

## 2022-08-19 NOTE — Progress Notes (Signed)
MD Agarwala notified that meds are not reconciled on AVS, per MD no changes to medications, no new prescriptions sent in. AVS printed as is, patient and patient's wife made aware.

## 2022-08-19 NOTE — Anesthesia Postprocedure Evaluation (Signed)
Anesthesia Post Note  Patient: Lawrence Long  Procedure(s) Performed: RIGHT LONG FINGER IRRIGATION AND DEBRIDEMENT (Right: Middle Finger)     Patient location during evaluation: PACU Anesthesia Type: MAC Level of consciousness: awake and alert Pain management: pain level controlled Vital Signs Assessment: post-procedure vital signs reviewed and stable Respiratory status: spontaneous breathing, nonlabored ventilation, respiratory function stable and patient connected to nasal cannula oxygen Cardiovascular status: stable and blood pressure returned to baseline Postop Assessment: no apparent nausea or vomiting Anesthetic complications: no   No notable events documented.  Last Vitals:  Vitals:   08/19/22 0810 08/19/22 1117  BP: (!) 118/95 116/82  Pulse: 62 88  Resp: 18 15  Temp: 36.5 C 37.1 C  SpO2: 97% 97%    Last Pain:  Vitals:   08/19/22 1117  TempSrc:   PainSc: 0-No pain                 Senoia Nation

## 2022-08-19 NOTE — Op Note (Addendum)
NAME: Lawrence Long MEDICAL RECORD NO: [id number] DATE OF BIRTH: 1965/08/06 FACILITY: [facility] LOCATION: [facility] OR PHYSICIAN: Samuella Cota, MD   OPERATIVE REPORT   DATE OF PROCEDURE: 08/19/22    PREOPERATIVE DIAGNOSIS: Right long finger pain and swelling, possible infection, possible foreign body   POSTOPERATIVE DIAGNOSIS: Right long finger felon   PROCEDURE: Right long finger irrigation debridement with closure   SURGEON:  Samuella Cota, M.D.   ASSISTANT: None   ANESTHESIA:  Local with sedation   INTRAVENOUS FLUIDS:  Per anesthesia flow sheet.   ESTIMATED BLOOD LOSS:  Minimal.   COMPLICATIONS:  None.   SPECIMENS: Cultures taken   TOURNIQUET TIME:    Total Tourniquet Time Documented: area (Right) - 8 minutes Total: area (Right) - 8 minutes    DISPOSITION:  Stable to PACU.   INDICATIONS: [name] who presented to the outpatient setting with ongoing swelling and pain with associated redness and drainage of the right long finger after crush injury.  There was also concern for possible foreign body.  Given his ongoing symptoms, patient was indicated for right long finger irrigation debridement, exploration and possible foreign body removal.  Risks and benefits of surgery were discussed including the risks of infection, bleeding, scarring, stiffness, nerve injury, vascular injury, tendon injury, need for subsequent operation, , need for repeat irrigation and debridement.  He voiced understanding of these risks and elected to proceed.  OPERATIVE COURSE: Patient was seen and identified in the preoperative area and marked appropriately.  Surgical consent had been signed. Antibiotics were held for intraoperative cultures. He was transferred to the operating room and placed in supine position with the Right upper extremity on an arm board.  Sedation was induced by the anesthesiologist.10 cc of 1% lidocaine plain was utilized for digital block purposes.  Right  upper extremity was prepped and draped in normal sterile orthopedic fashion.  A surgical pause was performed between the surgeons, anesthesia, and operating room staff and all were in agreement as to the patient, procedure, and site of procedure.  Turnicot to the right long finger was placed.  Longitudinal incision was designed at the distal pulp of the right long finger.  Blunt dissection was performed down, small amount of purulence was expressed from the finger wound.  Copious irrigation was performed.  No significant foreign body was identified with meticulous dissection.  Cultures were taken.  Antibiotics were subsequently given.  Copious irrigation was then repeated to ensure no ongoing drainage.  Nailbed was inspected, fenestrated to allow for decompression, again a small amount of purulence was noted.  Once were satisfied with our irrigation and debridement, 4-0 nylon was utilized to close the longitudinal incision over the distal pulp.  Turnicot was removed.  Sterile dressings were applied followed by a soft finger wrap for the long finger.  Fingertips were pink with brisk capillary refill after deflation of tourniquet.  The operative drapes were broken down.  The patient was awoken from anesthesia safely and taken to PACU in stable condition.  I will see him back in the office in 1 week for postoperative followup.     Samuella Cota, MD Electronically signed, 08/19/22

## 2022-08-19 NOTE — Transfer of Care (Signed)
Immediate Anesthesia Transfer of Care Note  Patient: Lawrence Long  Procedure(s) Performed: RIGHT LONG FINGER IRRIGATION AND DEBRIDEMENT (Right: Middle Finger)  Patient Location: PACU  Anesthesia Type:MAC  Level of Consciousness: awake, alert , and oriented  Airway & Oxygen Therapy: Patient Spontanous Breathing  Post-op Assessment: Report given to RN and Post -op Vital signs reviewed and stable  Post vital signs: Reviewed and stable  Last Vitals:  Vitals Value Taken Time  BP 116/82 08/19/22 1117  Temp    Pulse 77 08/19/22 1119  Resp 12 08/19/22 1119  SpO2 95 % 08/19/22 1119  Vitals shown include unfiled device data.  Last Pain:  Vitals:   08/19/22 0816  TempSrc:   PainSc: 2       Patients Stated Pain Goal: 0 (08/19/22 0816)  Complications: No notable events documented.

## 2022-08-19 NOTE — Discharge Instructions (Signed)
Hand Surgery Postop Instructions   Dressings: Maintain postoperative dressing until orthopedic follow-up.  Keep operative site clean and dry until orthopedic follow-up.  Wound Care: Keep your hand elevated above the level of your heart.  Do not allow it to dangle by your side. Moving your fingers is advised to stimulate circulation but will depend on the site of your surgery.  If you have a splint applied, your doctor will advise you regarding movement.  Activity: Do not drive or operate machinery until clearance given from physician. No heavy lifting with operative extremity.  Diet:  Drink liquids today or eat a light diet.  You may resume a regular diet tomorrow.    General expectations: Take prescribed medication if given, transition to over-the-counter medication as quickly as possible. Fingers may become slightly swollen.  Call your doctor if any of the following occur: Severe pain not relieved by pain medication. Elevated temperature. Dressing soaked with blood. Inability to move fingers. White or bluish color to fingers.

## 2022-08-19 NOTE — Interval H&P Note (Signed)
History and Physical Interval Note:  08/19/2022 9:50 AM  Lawrence Long  has presented today for surgery, with the diagnosis of RIGHT LONG FINGER INFECTION.  The various methods of treatment have been discussed with the patient and family. After consideration of risks, benefits and other options for treatment, the patient has consented to  Procedure(s): RIGHT LONG FINGER IRRIGATION AND DEBRIDEMENT/ POSSIBLE FOREIGN BODY REMOVAL (Right) as a surgical intervention.  The patient's history has been reviewed, patient examined, no change in status, stable for surgery.  I have reviewed the patient's chart and labs.  Questions were answered to the patient's satisfaction.     Johney Perotti

## 2022-08-20 ENCOUNTER — Encounter (HOSPITAL_COMMUNITY): Payer: Self-pay | Admitting: Orthopedic Surgery

## 2022-08-24 LAB — AEROBIC/ANAEROBIC CULTURE W GRAM STAIN (SURGICAL/DEEP WOUND)

## 2022-08-25 ENCOUNTER — Encounter: Payer: BC Managed Care – PPO | Admitting: Orthopedic Surgery

## 2022-08-26 ENCOUNTER — Telehealth: Payer: Self-pay | Admitting: Orthopedic Surgery

## 2022-08-26 NOTE — Telephone Encounter (Signed)
Pt called requesting a call back to set an post op appt. Pt states he didn't realized he missed his appt yesterday. Dr Fara Boros 1st available is not until 9/18 which will be to far out. Please call pt at [phone].

## 2022-08-26 NOTE — Telephone Encounter (Signed)
Called and spoke with patient. Put him on for 815 tomorrow morning

## 2022-08-27 ENCOUNTER — Ambulatory Visit (INDEPENDENT_AMBULATORY_CARE_PROVIDER_SITE_OTHER): Payer: BC Managed Care – PPO | Admitting: Orthopedic Surgery

## 2022-08-27 DIAGNOSIS — L089 Local infection of the skin and subcutaneous tissue, unspecified: Secondary | ICD-10-CM

## 2022-08-27 DIAGNOSIS — S60942A Unspecified superficial injury of right middle finger, initial encounter: Secondary | ICD-10-CM

## 2022-08-27 NOTE — Progress Notes (Signed)
   Orlander Stepfon Meyerowitz - 57 y.o. male MRN [id number]  Date of birth: 04-23-1965  Office Visit Note: Visit Date: 08/27/2022 PCP: Pcp, No Referred by: No ref. provider found   HPI: Markevion Tyrus Singleton is a pleasant 57 y.o. male who presents today for standard postop visit 9 days status post right long finger felon I&D with debridement and closure.  He is doing well overall, pain is controlled, mild bloody drainage from the region ongoing without evidence of purulence.  Range of motion is improving.  Pertinent ROS were reviewed with the patient and found to be negative unless otherwise specified above in HPI.   Assessment & Plan: Visit Diagnoses: No diagnosis found.  Plan: He is doing well overall.  Finger Splint was given today to be utilized as needed particular for any heavy work with the hand.  Suture was removed today as well.  He can do Betadine soaks as needed for ongoing drainage.  No evidence of significant purulence or ongoing infection at this time.  I did also reiterate that there was significant trauma to the nailbed as part of the initial injury, time will tell as to potential nail regrowth.  Antibiotics have been completed.  I will see him back in approximate 3 weeks for wound check.  Encouraged range of motion for the digit.  Follow-up: Return in about 3 weeks (around 09/17/2022).   Meds & Orders: No orders of the defined types were placed in this encounter.  No orders of the defined types were placed in this encounter.    Procedures: No procedures performed      Clinical History: No specialty comments available.  He reports that he has never smoked. He does not have any smokeless tobacco history on file. No results for input(s): "HGBA1C", "LABURIC" in the last 8760 hours.  Objective:   Vital Signs: There were no vitals taken for this visit.     Ortho Exam Right long finger: - Suture in place, no significant drainage expressible - Digital range of motion is  preserved, able to form composite fist without significant restriction - Nailbed with notable trauma, there is evidence of small nail plate beneath the eponychial fold which is well adhered - Sensation is intact distally, appropriate capillary refill  Imaging: No results found.  Past Medical/Family/Surgical/Social History: Medications & Allergies reviewed per EMR, new medications updated. There are no problems to display for this patient.  Past Medical History:  Diagnosis Date   Asthma    Hypertension    Family History  Family history unknown: Yes   Past Surgical History:  Procedure Laterality Date   APPENDECTOMY     INCISION AND DRAINAGE WOUND WITH FOREIGN BODY REMOVAL Right 08/19/2022   Procedure: RIGHT LONG FINGER IRRIGATION AND DEBRIDEMENT;  Surgeon: Samuella Cota, MD;  Location: [facility] OR;  Service: Orthopedics;  Laterality: Right;   Social History   Occupational History   Not on file  Tobacco Use   Smoking status: Never   Smokeless tobacco: Not on file  Substance and Sexual Activity   Alcohol use: Yes    Comment: occas   Drug use: No   Sexual activity: Not on file    Persia Lintner Fara Boros) Denese Killings, M.D. [facility] 8:41 AM

## 2022-08-31 DIAGNOSIS — L02511 Cutaneous abscess of right hand: Secondary | ICD-10-CM

## 2022-09-17 ENCOUNTER — Ambulatory Visit: Payer: BC Managed Care – PPO | Admitting: Orthopedic Surgery

## 2023-04-26 DIAGNOSIS — R0600 Dyspnea, unspecified: Secondary | ICD-10-CM | POA: Diagnosis not present

## 2023-04-26 DIAGNOSIS — R7303 Prediabetes: Secondary | ICD-10-CM | POA: Diagnosis not present

## 2023-04-26 DIAGNOSIS — I10 Essential (primary) hypertension: Secondary | ICD-10-CM | POA: Diagnosis not present

## 2023-04-26 DIAGNOSIS — E782 Mixed hyperlipidemia: Secondary | ICD-10-CM | POA: Diagnosis not present
